# Patient Record
Sex: Male | Born: 1955 | Race: Asian | Hispanic: No | Marital: Married | Smoking: Former smoker
Health system: Southern US, Community
[De-identification: ages and names within clinical notes are randomized; demographics above are authoritative.]

---

## 2010-03-20 ENCOUNTER — Emergency Department (HOSPITAL_COMMUNITY): Admission: EM | Admit: 2010-03-20 | Discharge: 2010-03-20 | Payer: Self-pay | Admitting: Emergency Medicine

## 2010-03-28 ENCOUNTER — Emergency Department (HOSPITAL_COMMUNITY): Admission: EM | Admit: 2010-03-28 | Discharge: 2010-03-29 | Payer: Self-pay | Admitting: Emergency Medicine

## 2010-04-11 ENCOUNTER — Ambulatory Visit: Payer: Self-pay | Admitting: Internal Medicine

## 2010-04-11 ENCOUNTER — Encounter (INDEPENDENT_AMBULATORY_CARE_PROVIDER_SITE_OTHER): Payer: Self-pay | Admitting: Family Medicine

## 2010-04-11 LAB — CONVERTED CEMR LAB
Alkaline Phosphatase: 63 units/L (ref 39–117)
Chloride: 105 meq/L (ref 96–112)
Creatinine, Ser: 0.93 mg/dL (ref 0.40–1.50)
Total Protein: 7 g/dL (ref 6.0–8.3)

## 2010-04-25 ENCOUNTER — Ambulatory Visit (HOSPITAL_COMMUNITY): Admission: RE | Admit: 2010-04-25 | Discharge: 2010-04-25 | Payer: Self-pay | Admitting: Family Medicine

## 2010-11-19 LAB — URINALYSIS, ROUTINE W REFLEX MICROSCOPIC
Bilirubin Urine: NEGATIVE
Hgb urine dipstick: NEGATIVE
Ketones, ur: NEGATIVE mg/dL
Protein, ur: NEGATIVE mg/dL
Specific Gravity, Urine: 1.02 (ref 1.005–1.030)
Urobilinogen, UA: 0.2 mg/dL (ref 0.0–1.0)
Urobilinogen, UA: 0.2 mg/dL (ref 0.0–1.0)
pH: 6.5 (ref 5.0–8.0)

## 2010-11-19 LAB — DIFFERENTIAL
Basophils Absolute: 0.1 10*3/uL (ref 0.0–0.1)
Basophils Absolute: 0.1 10*3/uL (ref 0.0–0.1)
Basophils Relative: 1 % (ref 0–1)
Eosinophils Absolute: 0.1 10*3/uL (ref 0.0–0.7)
Eosinophils Absolute: 0.1 10*3/uL (ref 0.0–0.7)
Eosinophils Relative: 1 % (ref 0–5)
Eosinophils Relative: 1 % (ref 0–5)
Lymphocytes Relative: 16 % (ref 12–46)
Monocytes Absolute: 0.5 10*3/uL (ref 0.1–1.0)
Monocytes Absolute: 1 10*3/uL (ref 0.1–1.0)
Neutro Abs: 8.3 10*3/uL — ABNORMAL HIGH (ref 1.7–7.7)
Neutrophils Relative %: 73 % (ref 43–77)

## 2010-11-19 LAB — BASIC METABOLIC PANEL
BUN: 23 mg/dL (ref 6–23)
CO2: 27 mEq/L (ref 19–32)
Calcium: 8.4 mg/dL (ref 8.4–10.5)
Creatinine, Ser: 1.22 mg/dL (ref 0.4–1.5)
GFR calc non Af Amer: >60 mL/min (ref >60)
Glucose, Bld: 79 mg/dL (ref 70–99)
Potassium: 3 mEq/L — ABNORMAL LOW (ref 3.5–5.1)
Sodium: 142 mEq/L (ref 135–145)

## 2010-11-19 LAB — URINE CULTURE

## 2010-11-19 LAB — URINE MICROSCOPIC-ADD ON

## 2010-11-19 LAB — HEPATIC FUNCTION PANEL
ALT: 30 U/L (ref 0–53)
Albumin: 3.8 g/dL (ref 3.5–5.2)
Alkaline Phosphatase: 78 U/L (ref 39–117)
Indirect Bilirubin: 0.5 mg/dL (ref 0.3–0.9)
Total Protein: 7.6 g/dL (ref 6.0–8.3)

## 2010-11-19 LAB — CBC
Hemoglobin: 14.8 g/dL (ref 13.0–17.0)
Hemoglobin: 15.8 g/dL (ref 13.0–17.0)
MCH: 28.2 pg (ref 26.0–34.0)
MCH: 28.3 pg (ref 26.0–34.0)
MCHC: 34.1 g/dL (ref 30.0–36.0)
MCV: 83 fL (ref 78.0–100.0)
MCV: 83.2 fL (ref 78.0–100.0)
Platelets: 263 10*3/uL (ref 150–400)
RBC: 5.6 MIL/uL (ref 4.22–5.81)
WBC: 10.8 10*3/uL — ABNORMAL HIGH (ref 4.0–10.5)

## 2010-11-19 LAB — GC/CHLAMYDIA PROBE AMP, URINE
Chlamydia, Swab/Urine, PCR: NEGATIVE
GC Probe Amp, Urine: NEGATIVE

## 2010-11-19 LAB — CLOSTRIDIUM DIFFICILE EIA

## 2010-11-19 LAB — OVA AND PARASITE EXAMINATION

## 2010-11-19 LAB — STOOL CULTURE

## 2011-09-23 ENCOUNTER — Ambulatory Visit (INDEPENDENT_AMBULATORY_CARE_PROVIDER_SITE_OTHER): Payer: Commercial Managed Care - PPO

## 2011-09-23 DIAGNOSIS — R05 Cough: Secondary | ICD-10-CM

## 2011-10-19 ENCOUNTER — Ambulatory Visit (INDEPENDENT_AMBULATORY_CARE_PROVIDER_SITE_OTHER): Payer: Commercial Managed Care - PPO | Admitting: Family Medicine

## 2011-10-19 VITALS — BP 113/76 | HR 74 | Temp 97.8°F | Resp 16 | Ht 64.0 in | Wt 186.0 lb

## 2011-10-19 DIAGNOSIS — H1030 Unspecified acute conjunctivitis, unspecified eye: Secondary | ICD-10-CM

## 2011-10-19 DIAGNOSIS — Z0000 Encounter for general adult medical examination without abnormal findings: Secondary | ICD-10-CM | POA: Insufficient documentation

## 2011-10-19 DIAGNOSIS — H109 Unspecified conjunctivitis: Secondary | ICD-10-CM

## 2011-10-19 DIAGNOSIS — J209 Acute bronchitis, unspecified: Secondary | ICD-10-CM

## 2011-10-19 DIAGNOSIS — R059 Cough, unspecified: Secondary | ICD-10-CM

## 2011-10-19 DIAGNOSIS — E663 Overweight: Secondary | ICD-10-CM

## 2011-10-19 DIAGNOSIS — R05 Cough: Secondary | ICD-10-CM

## 2011-10-19 MED ORDER — LEVOFLOXACIN 500 MG PO TABS: 500.0000 mg | ORAL_TABLET | Freq: Every day | ORAL | Status: AC

## 2011-10-19 MED ORDER — TOBRAMYCIN 0.3 % OP SOLN: 1.0000 [drp] | OPHTHALMIC | Status: AC

## 2011-10-19 MED ORDER — HYDROCODONE-HOMATROPINE 5-1.5 MG/5ML PO SYRP
5.0000 mL | ORAL_SOLUTION | Freq: Four times a day (QID) | ORAL | Status: AC | PRN
Start: 2011-10-19 — End: 2011-10-29

## 2011-10-19 NOTE — Progress Notes (Signed)
  Subjective:    Patient ID: Nathaniel Silva, male    DOB: [birth year], 56 y.o.   MRN: [id number]  Cough This is a new problem. The current episode started in the past 7 days. The problem has been gradually worsening. The problem occurs constantly. The cough is productive of purulent sputum. Associated symptoms include nasal congestion, rhinorrhea, a sore throat and wheezing. Pertinent negatives include no chest pain, ear congestion, fever, heartburn, hemoptysis, myalgias, rash, shortness of breath, sweats or weight loss.  Sore Throat  Associated symptoms include coughing. Pertinent negatives include no shortness of breath.      Review of Systems  Constitutional: Negative for fever and weight loss.  HENT: Positive for sore throat and rhinorrhea.   Respiratory: Positive for cough and wheezing. Negative for hemoptysis and shortness of breath.   Cardiovascular: Negative for chest pain.  Gastrointestinal: Negative for heartburn.  Musculoskeletal: Negative for myalgias.  Skin: Negative for rash.  All other systems reviewed and are negative.       Objective:   Physical Exam  Constitutional: He is oriented to person, place, and time. He appears well-developed and well-nourished.  HENT:  Head: Normocephalic and atraumatic.  Right Ear: External ear normal.  Left Ear: External ear normal.  Mouth/Throat: Oropharynx is clear and moist.  Eyes: Pupils are equal, round, and reactive to light. Right eye discharge: red left conjunctiva.  Neck: Normal range of motion. Neck supple. No thyromegaly present.  Cardiovascular: Normal rate, normal heart sounds and intact distal pulses.   Pulmonary/Chest: Effort normal and breath sounds normal.  Lymphadenopathy:    He has no cervical adenopathy.  Neurological: He is alert and oriented to person, place, and time.  Skin: Skin is warm and dry.  Psychiatric: He has a normal mood and affect. His behavior is normal. Thought content normal.            Assessment & Plan:  Mild conjunctivitis Acute, recurrent bronchitis  PLAN: tobrex eye levaquin hydromet Work note today

## 2012-12-07 ENCOUNTER — Emergency Department (HOSPITAL_COMMUNITY)
Admission: EM | Admit: 2012-12-07 | Discharge: 2012-12-07 | Disposition: A | Discharge status: Home / Self Care | Payer: BC Managed Care – PPO | Source: Home / Self Care | Attending: Emergency Medicine | Admitting: Emergency Medicine

## 2012-12-07 ENCOUNTER — Encounter (HOSPITAL_COMMUNITY): Payer: Self-pay | Admitting: Emergency Medicine

## 2012-12-07 DIAGNOSIS — L282 Other prurigo: Secondary | ICD-10-CM

## 2012-12-07 MED ORDER — PREDNISONE 20 MG PO TABS: 40.0000 mg | ORAL_TABLET | Freq: Every day | ORAL | Status: DC

## 2012-12-07 MED ORDER — PREDNISONE 20 MG PO TABS
ORAL_TABLET | ORAL | Status: AC
  Filled 2012-12-07: qty 3

## 2012-12-07 MED ORDER — PREDNISONE 20 MG PO TABS
60.0000 mg | ORAL_TABLET | Freq: Once | ORAL | Status: AC
  Administered 2012-12-07: 60 mg via ORAL

## 2012-12-07 MED ORDER — HYDROXYZINE HCL 25 MG PO TABS: 25.0000 mg | ORAL_TABLET | Freq: Four times a day (QID) | ORAL | Status: DC

## 2012-12-07 NOTE — ED Notes (Signed)
C/o itching for one week.  Reports posterior areas of body itching, keeps him awake at night, frequently wakes him up

## 2012-12-12 NOTE — ED Provider Notes (Signed)
History     CSN: [id number]  Arrival date & time 12/07/12  1435   First MD Initiated Contact with Patient 12/07/12 1507      Chief Complaint  Patient presents with  . Pruritis    HPI: Patient is a 57 y.o. male presenting with rash. The history is provided by the patient.  Rash Location:  Torso, shoulder/arm and leg Shoulder/arm rash location:  R forearm and L forearm Torso rash location:  Lower back and upper back Leg rash location:  R upper leg and L upper leg Quality: itchiness and redness   Onset quality:  Gradual Duration:  1 week Timing:  Constant Progression:  Worsening Chronicity:  New Context: not animal contact, not chemical exposure, not exposure to similar rash, not food, not insect bite/sting, not medications and not new detergent/soap   Ineffective treatments:  Antihistamines Associated symptoms: no fever, no throat swelling, no tongue swelling and no URI   Pt reports a one-week history of persistent itchy rash. States the itching keeps him up at night. The itching to his back is the worst. Also reports areas of itchy rash to bilateral inner thighs and bilateral posterior forearms Denies ever having this rash before. Denies new exposures to food, chemicals or medications. No else in the home has had this rash. Denies any recent yard. Denies any respiratory or other associated symptoms. Has received minimal relief of itching w/ Benadryl.   History reviewed. No pertinent past medical history.  History reviewed. No pertinent past surgical history.  No family history on file.  History  Substance Use Topics  . Smoking status: Former Games developer  . Smokeless tobacco: Not on file  . Alcohol Use: No      Review of Systems  Constitutional: Negative.  Negative for fever.  HENT: Negative.   Eyes: Negative.   Respiratory: Negative.   Cardiovascular: Negative.   Gastrointestinal: Negative.   Endocrine: Negative.   Genitourinary: Negative.   Skin: Positive for rash.   Allergic/Immunologic: Negative.   Neurological: Negative.   Hematological: Negative.   Psychiatric/Behavioral: Negative.     Allergies  Review of patient's allergies indicates no known allergies.  Home Medications   Current Outpatient Rx  Name  Route  Sig  Dispense  Refill  . benzonatate (TESSALON) 100 MG capsule   Oral   Take 100 mg by mouth 3 (three) times daily as needed.         . hydrOXYzine (ATARAX/VISTARIL) 25 MG tablet   Oral   Take 1 tablet (25 mg total) by mouth every 6 (six) hours. Itching   12 tablet   0   . predniSONE (DELTASONE) 20 MG tablet   Oral   Take 2 tablets (40 mg total) by mouth daily.   10 tablet   0     BP 124/88  Pulse 63  Temp(Src) 97.2 F (36.2 C) (Oral)  Resp 18  SpO2 100%  Physical Exam  Constitutional: He is oriented to person, place, and time. He appears well-developed and well-nourished.  HENT:  Head: Normocephalic and atraumatic.  Eyes: Conjunctivae are normal.  Neck: Neck supple.  Cardiovascular: Normal rate and regular rhythm.   Pulmonary/Chest: Effort normal and breath sounds normal.  Musculoskeletal: Normal range of motion.  Neurological: He is alert and oriented to person, place, and time.  Skin: Skin is warm and dry. Rash noted.  Tiny, fine, erythematous, raised papules noted to entire back. Areas to bilateral posterior forearms and bilateral inner thighs noted with similar rash  though somewhat more sparse.   Psychiatric: He has a normal mood and affect.    ED Course  Procedures (including critical care time)  Labs Reviewed - No data to display No results found.   1. Pruritic rash       MDM  1 week h/o pruritic rash to back, posterior bil FA's and bil inner thighs. No known allergens. No h/o similar rash. No resp sx's. Minimal relief w/ Benadryl. Will treat w/ Prednisone x 1 week and Hydroxyzine for itching.         Leanne Chang, NP 12/12/12 [phone]

## 2012-12-12 NOTE — ED Provider Notes (Signed)
Medical screening examination/treatment/procedure(s) were performed by non-physician practitioner and as supervising physician I was immediately available for consultation/collaboration.  Patrisia Faeth, M.D.  Ashlley Booher C Jazmarie Biever, MD 12/12/12 2224 

## 2014-10-01 ENCOUNTER — Emergency Department (HOSPITAL_COMMUNITY): Payer: 59

## 2014-10-01 ENCOUNTER — Emergency Department (HOSPITAL_COMMUNITY)
Admission: EM | Admit: 2014-10-01 | Discharge: 2014-10-01 | Disposition: A | Discharge status: Home / Self Care | Payer: 59 | Attending: Emergency Medicine | Admitting: Emergency Medicine

## 2014-10-01 ENCOUNTER — Encounter (HOSPITAL_COMMUNITY): Payer: Self-pay

## 2014-10-01 DIAGNOSIS — T1490XA Injury, unspecified, initial encounter: Secondary | ICD-10-CM

## 2014-10-01 DIAGNOSIS — Y998 Other external cause status: Secondary | ICD-10-CM | POA: Diagnosis not present

## 2014-10-01 DIAGNOSIS — S20211A Contusion of right front wall of thorax, initial encounter: Secondary | ICD-10-CM | POA: Diagnosis not present

## 2014-10-01 DIAGNOSIS — W000XXA Fall on same level due to ice and snow, initial encounter: Secondary | ICD-10-CM | POA: Insufficient documentation

## 2014-10-01 DIAGNOSIS — Y9289 Other specified places as the place of occurrence of the external cause: Secondary | ICD-10-CM | POA: Insufficient documentation

## 2014-10-01 DIAGNOSIS — Z87891 Personal history of nicotine dependence: Secondary | ICD-10-CM | POA: Diagnosis not present

## 2014-10-01 DIAGNOSIS — Y9389 Activity, other specified: Secondary | ICD-10-CM | POA: Insufficient documentation

## 2014-10-01 DIAGNOSIS — S40011A Contusion of right shoulder, initial encounter: Secondary | ICD-10-CM | POA: Diagnosis not present

## 2014-10-01 DIAGNOSIS — S299XXA Unspecified injury of thorax, initial encounter: Secondary | ICD-10-CM | POA: Diagnosis present

## 2014-10-01 MED ORDER — NAPROXEN 500 MG PO TABS: 500.0000 mg | ORAL_TABLET | Freq: Two times a day (BID) | ORAL | Status: DC

## 2014-10-01 MED ORDER — KETOROLAC TROMETHAMINE 60 MG/2ML IM SOLN
60.0000 mg | Freq: Once | INTRAMUSCULAR | Status: AC
  Administered 2014-10-01: 60 mg via INTRAMUSCULAR
  Filled 2014-10-01: qty 2

## 2014-10-01 NOTE — ED Notes (Signed)
Pt fell on right side on Monday.  Slipped on ice.  Unable to get pain under control.  Some shortness of breath d/t pain.

## 2014-10-01 NOTE — ED Notes (Signed)
Patient transported to X-ray 

## 2014-10-01 NOTE — Discharge Instructions (Signed)
xrays are normal - no broken bones!  Naprosyn twice daily.  Please call your doctor for a followup appointment within 24-48 hours. When you talk to your doctor please let them know that you were seen in the emergency department and have them acquire all of your records so that they can discuss the findings with you and formulate a treatment plan to fully care for your new and ongoing problems.   Emergency Department Resource Guide 1) Find a Doctor and Pay Out of Pocket Although you won't have to find out who is covered by your insurance plan, it is a good idea to ask around and get recommendations. You will then need to call the office and see if the doctor you have chosen will accept you as a new patient and what types of options they offer for patients who are self-pay. Some doctors offer discounts or will set up payment plans for their patients who do not have insurance, but you will need to ask so you aren't surprised when you get to your appointment.  2) Contact Your Local Health Department Not all health departments have doctors that can see patients for sick visits, but many do, so it is worth a call to see if yours does. If you don't know where your local health department is, you can check in your phone book. The CDC also has a tool to help you locate your state's health department, and many state websites also have listings of all of their local health departments.  3) Find a Walk-in Clinic If your illness is not likely to be very severe or complicated, you may want to try a walk in clinic. These are popping up all over the country in pharmacies, drugstores, and shopping centers. They're usually staffed by nurse practitioners or physician assistants that have been trained to treat common illnesses and complaints. They're usually fairly quick and inexpensive. However, if you have serious medical issues or chronic medical problems, these are probably not your best option.  No Primary Care  Doctor: - Call Health Connect at  [phone] - they can help you locate a primary care doctor that  accepts your insurance, provides certain services, etc. - Physician Referral Service- [phone]  Chronic Pain Problems: Organization         Address  Phone   Notes  [facility]  [phone] Patients need to be referred by their primary care doctor.   Medication Assistance: Organization         Address  Phone   Notes  [facility]ication [facility] [address] [place], [place] [address] [phone] --Must be a resident of [facility] [place] -- Must have NO insurance coverage whatsoever (no Medicaid/ Medicare, etc.) -- The pt. MUST have a primary care doctor that directs their care regularly and follows them in the community   MedAssist  [phone]   [employer]  [phone]    Agencies that provide inexpensive medical care: Organization         Address  Phone   Notes  [facility]  [phone]   [facility]    [phone]   [facility] [address] [place], [place] [address] [phone]   [facility] [address], [place] [phone]   Planned Parenthood    [phone]   [facility]    [phone]   Community Health  and [address], [place] Phone:  [phone], Fax:  [phone] Hours of Operation:  9 am - 6 pm, M-F.  Also accepts Medicaid/Medicare and self-pay.  [facility] for [place], [address], [place] Phone: [phone], Fax: [phone]. Hours of Operation:  8:30 am - 5:30 pm, M-F.  Also accepts Medicaid and self-pay.  [facility] [place] [address] [place] Phone: [phone]   [place], [place], [place] [phone], Ext. [phone] Mondays & Thursdays: 7-9 AM.  First 15 patients are seen on a first  come, first serve basis.    [place] Providers:  Organization         Address  Phone   Notes  [facility] [address], [place] [phone] Also accepts self-pay patients.  [facility] [address], [place]  [phone]   [place], [address], [place] [phone]   [facility] [address] [place] [phone]   Lucianne Lei [address], [place]   [phone] Only accepts [place] Access [place] patients after they have their name applied to their card.   Self-Pay (no insurance) in [facility]:  Organization         Address  Phone   Notes  Sickle Cell Patients, [facility] [place] [phone]   [facility] [place] [phone]   [facility] [place]  [place], [place], [place] [phone]   [facility]/Dr. Osei-Bonsu  [address] [place] or [address], [place] [phone] Phone number for both [place] and [place] locations is the same.  [facility] [address] [place] [phone]   [facility] [address], [place] or [address] [phone]   [facility] [address], [place] [phone], phone; [phone], fax Sees patients 1st and 3rd Saturday of every month.  Must not qualify for public or private insurance (i.e. Medicaid, Medicare, [place] Health Choice, Veterans' Benefits)  Household income should be no more than 200% of the poverty level The clinic cannot treat you if you are pregnant or think you are pregnant  Sexually transmitted diseases are not treated at the clinic.    Dental Care: Organization          Address  Phone  Notes  [facility] [place] [phone] Accepts children up to age 79 who are enrolled in [place] or [place]; pregnant women with a Medicaid card; and children who have applied for Medicaid or [place] Health Choice, but were declined, whose parents can pay a reduced fee at time of service.  [facility] Department of [facility]  [address], [place] [phone] Accepts children up to age 42 who are enrolled in [place] or [place]; pregnant women with a Medicaid card; and children who have applied for Medicaid or [place] Health Choice, but were declined, whose parents can pay a reduced fee at time of service.  [facility]  [address]  Mardene Speak [phone] Patients are seen by appointment only. Walk-ins are not accepted. [place] will see patients 60 years of age and older. Monday - Tuesday (8am-5pm) Most Wednesdays (8:30-5pm) $30 per visit, cash only  [facility] Adult [facility] Access PROGRAM  [address], [facility] [phone] Patients are seen by appointment only. Walk-ins are not accepted. Clarissa will see patients 9 years of age and older. One Wednesday Evening (Monthly: Volunteer Based).  $30 per visit, cash only  [place]  [phone] for adults; Children under age 8, call Graduate Pediatric Dentistry at [phone]. Children aged 87-14, please call [phone] to request a pediatric application.  Dental services are provided in all areas of dental care including fillings, crowns and bridges, complete and partial dentures, implants, gum treatment, root canals, and extractions. Preventive care is also provided. Treatment is provided to both adults and children. Patients are selected via a lottery and there is often a waiting list.   [facility] [address], [place]  [phone] [email or web address]   [facility] [address] [place], [place] [phone], Ext. [phone] Second and Fourth Thursday of each month, opens at 6:30 AM; Clinic ends at 9 AM.  Patients are seen on a first-come first-served basis, and a limited number are seen during each clinic.   [facility]  [address] Hillard Danker [place], [place] [phone]   Eligibility Requirements You must have lived in [place], [place], or [place] counties for at least the last three months.   You cannot be eligible for state or federal sponsored [employer], including [employer], [place], or [employer].   You generally cannot be eligible for healthcare insurance through your employer.    How to apply: Eligibility screenings are held every Tuesday and Wednesday afternoon from 1:00 pm until 4:00 pm. You do not need an appointment for the interview!  [facility] [address] [place], [place]   [place]  [place] Department  [place]  [phone]    Behavioral Health Resources in the Community: Intensive Outpatient Programs Organization         Address  Phone  Notes  [facility] [address], [place], [place] [phone]   [facility] Outpatient [address], [place], [place]   ADS: Alcohol & Drug Svcs [address], [place], [place]   [place] [address]  [place], [place] or [phone]   Substance Abuse Resources Organization         Address  Phone  Notes  Alcohol and Drug Services  [phone]   [place]  [phone]   [facility] [place]   Chinita Pester  [phone]   Residential & Outpatient Substance Abuse Program  [phone]   Psychological  Services Organization         Address  Phone  Notes  [facility]  [place]  [phone]   [place] [address] [place] or [phone]    Mobile Crisis Teams Organization         Address  Phone  Notes  [facility], Mobile Crisis Care Unit  [phone]   Assertive Psychotherapeutic Services  [address] [place], [place]   [place] Levels  [place] [phone]    Self-Help/Support Groups Organization         Address  Phone             Notes  [facility] of [place] - variety of support groups  [place] Call for more information  Narcotics Anonymous (NA), [facility]aring [facility] [address], [employer] [place]  2 meetings at this location   Special educational needs teacher         Address  Phone  Notes  [facility] Residential Treatment [place],    [place]  [phone]   [facility]  [address] [place] [id number], [employer], [place]   [place], [place] [phone] Admissions: 8am-3pm M-F  Incentives Substance [place] [address],    [place], [place] [phone]   [facility] [address] [place], [place], [place]   [facility] [address],  [place], [place]   [facility] Programs - Intensive Outpatient [address], [place], [place]   [facility] [place] ([place].) [place] [address].,  [place], [place] [phone] or [phone]   Residential Treatment Services (RTS) [address] [place], [place] Accepts Medicaid  [facility] [place] [address]  [place] [phone] Substance Abuse/Addiction Treatment   [facility] Organization         Address  Phone  Notes  [facility]  [phone]   Domenic Schwab, PhD [address] Arlis [place], [place]   [phone] or [phone]   [place], [place] [phone]   [facility] [address] [place], [place] [phone] Insurance/Medicaid/sponsorship through [facility] and Families [address]                                    [place], [place] [phone] [place] [address], [place] [phone]    Dr. Adele Schilder  [phone]   [facility] Dept. 1) [address], [place] 2) [place] 3)  [place] [address], [place] [phone]  [phone]   [place] [phone] or [phone] (After Hours)

## 2014-10-01 NOTE — ED Provider Notes (Signed)
CSN: [id number]     Arrival date & time 10/01/14  [id number] History   First MD Initiated Contact with Patient 10/01/14 0919     Chief Complaint  Patient presents with  . Rib Injury     (Consider location/radiation/quality/duration/timing/severity/associated sxs/prior Treatment) HPI Comments: Pt had fall 3 days ago which occurred on snow - fell to the R side - shoulder, ribs knee and wrist. Symptoms are persistent, gradually improving though they still hurt. He has been using Aleve with minimal improvement, denies head injury, neck pain, numbness or weakness. Ambulate without difficulty, no cough, no fever.  The history is provided by the patient.    History reviewed. No pertinent past medical history. History reviewed. No pertinent past surgical history. History reviewed. No pertinent family history. History  Substance Use Topics  . Smoking status: Former Games developermoker  . Smokeless tobacco: Not on file  . Alcohol Use: No    Review of Systems  All other systems reviewed and are negative.     Allergies  Review of patient's allergies indicates no known allergies.  Home Medications   Prior to Admission medications   Medication Sig Start Date End Date Taking? Authorizing Provider  hydrOXYzine (ATARAX/VISTARIL) 25 MG tablet Take 1 tablet (25 mg total) by mouth every 6 (six) hours. Itching Patient not taking: Reported on 10/01/2014 12/07/12   Roma KayserKatherine P Schorr, NP  naproxen (NAPROSYN) 500 MG tablet Take 1 tablet (500 mg total) by mouth 2 (two) times daily with a meal. 10/01/14   Vida RollerBrian D Latoria Dry, MD  predniSONE (DELTASONE) 20 MG tablet Take 2 tablets (40 mg total) by mouth daily. Patient not taking: Reported on 10/01/2014 12/07/12   Roma KayserKatherine P Schorr, NP   BP 155/91 mmHg  Pulse 70  Temp(Src) 97.4 F (36.3 C) (Oral)  Resp 18  SpO2 98% Physical Exam  Constitutional: He appears well-developed and well-nourished. No distress.  HENT:  Head: Normocephalic and atraumatic.  Mouth/Throat:  Oropharynx is clear and moist. No oropharyngeal exudate.  Eyes: Conjunctivae and EOM are normal. Pupils are equal, round, and reactive to light. Right eye exhibits no discharge. Left eye exhibits no discharge. No scleral icterus.  Neck: Normal range of motion. Neck supple. No JVD present. No thyromegaly present.  Cardiovascular: Normal rate, regular rhythm, normal heart sounds and intact distal pulses.  Exam reveals no gallop and no friction rub.   No murmur heard. Pulmonary/Chest: Effort normal and breath sounds normal. No respiratory distress. He has no wheezes. He has no rales. He exhibits tenderness ( Tender to palpation over the right anterolateral inferior chest wall, no crepitance or subcutaneous emphysema).  Abdominal: Soft. Bowel sounds are normal. He exhibits no distension and no mass. There is no tenderness.  Musculoskeletal: Normal range of motion. He exhibits no edema or tenderness.  Supple joints including right shoulder, right elbow, right wrist, right knee, right hip, right ankle. No deformity, minimal pain with range of motion of the right shoulder  Lymphadenopathy:    He has no cervical adenopathy.  Neurological: He is alert. Coordination normal.  Normal speech, normal gait, normal coordination  Skin: Skin is warm and dry. No rash noted. No erythema.  Psychiatric: He has a normal mood and affect. His behavior is normal.  Nursing note and vitals reviewed.   ED Course  Procedures (including critical care time) Labs Review Labs Reviewed - No data to display  Imaging Review Dg Ribs Unilateral W/chest Right  10/01/2014   CLINICAL DATA:  Acute right rib pain after  falling on ice 4 days ago. Initial encounter.  EXAM: RIGHT RIBS AND CHEST - 3+ VIEW  COMPARISON:  None.  FINDINGS: No fracture or other bone lesions are seen involving the ribs. There is no evidence of pneumothorax or pleural effusion. Both lungs are clear. Heart size and mediastinal contours are within normal limits.   IMPRESSION: No abnormality seen involving the right ribs. No acute cardiopulmonary abnormality seen.   Electronically Signed   By: Roque Lias M.D.   On: 10/01/2014 10:24   Dg Shoulder Right  10/01/2014   CLINICAL DATA:  Patient fell on ice 4 days prior.  Pain  EXAM: RIGHT SHOULDER - 2+ VIEW  COMPARISON:  None.  FINDINGS: Frontal, Y scapular, and axillary images were obtained. There is no appreciable fracture or dislocation. Joint spaces appear intact. No erosive change or intra-articular calcification.  IMPRESSION: No fracture or dislocation.  No appreciable arthropathy.   Electronically Signed   By: Bretta Bang M.D.   On: 10/01/2014 10:21      MDM   Final diagnoses:  Trauma  Contusion of ribs, right, initial encounter  Contusion of right shoulder, initial encounter    Injuries to the right side of the patient's body, very low suspicion for injury to the right hand or wrist or the right knee. He does have increased pain in the right shoulder and wrist with palpation, imaging pending, pain medication ordered.  Xray neg, stable for d/c.  Meds given in ED:  Medications  ketorolac (TORADOL) injection 60 mg (60 mg Intramuscular Given 10/01/14 0943)    New Prescriptions   NAPROXEN (NAPROSYN) 500 MG TABLET    Take 1 tablet (500 mg total) by mouth 2 (two) times daily with a meal.       Vida Roller, MD 10/01/14 1032

## 2014-10-14 ENCOUNTER — Encounter: Payer: Self-pay | Admitting: Family

## 2014-10-14 ENCOUNTER — Ambulatory Visit (INDEPENDENT_AMBULATORY_CARE_PROVIDER_SITE_OTHER): Payer: 59 | Admitting: Family

## 2014-10-14 VITALS — BP 160/112 | HR 69 | Temp 97.9°F | Resp 18 | Ht 66.0 in | Wt 196.8 lb

## 2014-10-14 DIAGNOSIS — M25561 Pain in right knee: Secondary | ICD-10-CM

## 2014-10-14 DIAGNOSIS — W009XXD Unspecified fall due to ice and snow, subsequent encounter: Secondary | ICD-10-CM

## 2014-10-14 DIAGNOSIS — M25511 Pain in right shoulder: Secondary | ICD-10-CM

## 2014-10-14 DIAGNOSIS — W009XXA Unspecified fall due to ice and snow, initial encounter: Secondary | ICD-10-CM | POA: Insufficient documentation

## 2014-10-14 MED ORDER — MELOXICAM 7.5 MG PO TABS: 7.5000 mg | ORAL_TABLET | Freq: Every day | ORAL | Status: DC

## 2014-10-14 MED ORDER — METHYLPREDNISOLONE (PAK) 4 MG PO TABS: ORAL_TABLET | ORAL | Status: DC

## 2014-10-14 NOTE — Assessment & Plan Note (Signed)
Patient continues to experience soreness in his right shoulder ribs and right knee from his fall. Symptoms and exam are consistent with contusions. Imaging was negative for fractures or abnormalities. Discontinue Naprosyn. Start Mobic and Medrol Dosepak. Recommend heat and ice as needed for pain and soreness. Follow up if symptoms worsen or fail to improve.

## 2014-10-14 NOTE — Progress Notes (Signed)
Subjective:    Patient ID: Nathaniel Silva, male    DOB: [birth year], 59 y.o.   MRN: [id number]  Chief Complaint  Patient presents with  . Establish Care    Came about his visit to the ED    HPI:  Nathaniel Silva is a 59 y.o. male who presents today to establish care and discuss a recent ED visit.  Patient was recently seen in the emergency room following a fall on his right side indicating shoulder, ribs, knee, and wrist pain. X-ray results of the right ribs and right shoulder were negative for fracture or abnormality. He was given a shot of Toradol and released from the emergency room. All emergency room notes were reviewed in detail and x-rays were independently reviewed.  Associated symptoms of soreness in his right ribs, right knee and right shoulder have been going on for about 2 weeks since the initial fall. Currently continues to experiencing a pain intensity of a 7/10 in the ribs, a 3/10 in the right shoulder, and a 2/10 for the right knee. Pain in these areas is described as soreness. Currently takes naproxen which he indicates does help some.    No Known Allergies   Current Outpatient Prescriptions on File Prior to Visit  Medication Sig Dispense Refill  . hydrOXYzine (ATARAX/VISTARIL) 25 MG tablet Take 1 tablet (25 mg total) by mouth every 6 (six) hours. Itching 12 tablet 0  . naproxen (NAPROSYN) 500 MG tablet Take 1 tablet (500 mg total) by mouth 2 (two) times daily with a meal. 30 tablet 0   No current facility-administered medications on file prior to visit.    History reviewed. No pertinent past medical history.  History reviewed. No pertinent past surgical history.  History reviewed. No pertinent family history.  History   Social History  . Marital Status: Married    Spouse Name: N/A  . Number of Children: 2  . Years of Education: N/A   Occupational History  . cook    Social History Main Topics  . Smoking status: Former Smoker -- 0.50 packs/day for 15 years      Types: Cigarettes  . Smokeless tobacco: Never Used  . Alcohol Use: No  . Drug Use: No  . Sexual Activity: Not on file   Other Topics Concern  . Not on file   Social History Narrative   Born and raised in [place]. Currently lives in a house with his wife and children and son-in-law. No pets. Fun: Movies   Denies religious belief that would effect health care.     Review of Systems  Respiratory: Negative for chest tightness and shortness of breath.   Cardiovascular: Negative for chest pain.  Musculoskeletal: Positive for arthralgias.  Neurological: Positive for numbness.      Objective:    BP 160/112 mmHg  Pulse 69  Temp(Src) 97.9 F (36.6 C) (Oral)  Resp 18  Ht 5\' 6"  (1.676 m)  Wt 196 lb 12.8 oz (89.268 kg)  BMI 31.78 kg/m2  SpO2 98% Nursing note and vital signs reviewed.  Physical Exam  Constitutional: He is oriented to person, place, and time. He appears well-developed and well-nourished. No distress.  Cardiovascular: Normal rate, regular rhythm, normal heart sounds and intact distal pulses.   Pulmonary/Chest: Effort normal and breath sounds normal.  Musculoskeletal:  Right shoulder: No obvious deformity or discoloration noted. Mild edema present. Displays full range of motion in all directions. Tenderness elicited over subacromial space. Negative impingement; negative apprehension; negative drop arm. Right  knee: No obvious deformity, discoloration, or edema of right knee noted. Mild tenderness elicited over patellar tendon. Ligaments are intact and appropriate. Muscle strength is 4+/5. Right rib: No obvious deformities, however mild discoloration remains. Palpable tenderness along ribs 6 through 8. No deformities noted.  Neurological: He is alert and oriented to person, place, and time.  Skin: Skin is warm and dry.  Psychiatric: He has a normal mood and affect. His behavior is normal. Judgment and thought content normal.       Assessment & Plan:

## 2014-10-14 NOTE — Patient Instructions (Addendum)
Thank you for choosing [facility].  Summary/Instructions:  Please schedule a time for your physical.   Please stop taking the naproxen and start taking the Mobic and steroid dose pack.   Please use ice/heat on the sore areas as needed.  Try using the thermacare for consistent heat  For coughing and sneezing, splint your ribs with your arms or a pillow.  Your prescription(s) have been submitted to your pharmacy or been printed and provided for you. Please take as directed and contact our office if you believe you are having problem(s) with the medication(s) or have any questions.  Referrals have been made during this visit. You should expect to hear back from our schedulers in about 7-10 days in regards to establishing an appointment with the specialists we discussed.   If your symptoms worsen or fail to improve, please contact our office for further instruction, or in case of emergency go directly to the emergency room at the closest medical facility.    ??ng d?p (Contusion) ??ng d?p l v?t thm tm su. ??ng d?p l h?u qu? c?a ch?n th??ng gy ch?y mu d??i da. ??ng d?p c th? chuy?n thnh mu xanh, tm ho?c vng. Ch?n th??ng nh? s? ?? l?i v?t ??ng d?p khng ?au, nh?ng nh?ng ??ng d?p nghim tr?ng h?n c th? gy ?au ??n v s?ng m?t vi tu?n.  NGUYN NHN  ??ng d?p th??ng do m?t c ?nh, ch?n th??ng ho?c l?c tc ??ng tr?c ti?p ln m?t vng c?a c? th? gy ra. TRI?U CH?NG   S?ng v t?y ?? vng b? th??ng.  Thm tm vng b? th??ng.  Nh?y c?m ?au v ?au nh?c vng b? th??ng.  ?au. CH?N ?ON  Ch?n ?on c th? ???c ??a ra b?ng cch ki?m tra ti?n s? v khm th?c th?. C th? c?n ch?p X-quang, CT ho?c MRI ?? xc ??nh xem c b?t k? ch?n th??ng no lin quan, ch?ng h?n nh? gy x??ng. ?I?U TR?  ?i?u tr? c? th? s? ph? thu?c vo vng c? th? b? th??ng. Ni chung, bi?n php ?i?u tr? ??ng d?p t?t nh?t l ngh? ng?i, ch??m ?, nng cao v ch??m l?nh vng b? th??ng. Thu?c khng c?n k ??n c?ng  c th? ???c khuyn dng ?? ki?m sot c?n ?au. H?i chuyn gia ch?m [place] s?c kh?e cch ?i?u tr? t?t nh?t cho ch? ??ng d?p c?a qu v?. H??NG D?N CH?M [place] T?I NH   Ch??m ? l?nh ln vng b? th??ng.  Cho ? l?nh vo ti nh?a.  ?? kh?n t?m vo gi?a da v ti.  ?? ? l?nh trong kho?ng 15-20 pht, 3-4 l?n m?i ngy, ho?c theo ch? d?n c?a chuyn gia ch?m [place] s?c kh?e c?a qu v?.  Ch? s? d?ng thu?c khng c?n k ??n ho?c thu?c c?n k ??n ?? gi?m ?au, gi?m c?m gic kh ch?u ho?c h? s?t theo ch? d?n c?a chuyn gia ch?m [place] s?c kh?e c?a qu v?. Chuyn gia ch?m [place] s?c kh?e c th? [place]khuyn qu v? trnh s? d?ng cc thu?c ch?ng vim (aspirin, ibuprofen v naproxen) trong 48 gi? v nh?ng thu?c ny c th? lm thm tm t?ng ln.  ?? vng b? th??ng ngh? ng?i.  N?u c th?, hy nng cao vng b? th??ng ?? gi?m s?ng. NGAY L?P T?C ?I KHM N?U:   Qu v? b? thm tm ho?c s?ng t?ng ln.  Qu v? b? ?au ngy cng nhi?u.  S?ng hay ?au nh?c khng thuyn gi?m sau khi dng thu?c. ??M B?O QU  V?:   Hi?u r cc h??ng d?n ny.  S? theo di tnh tr?ng c?a mnh.  S? yu c?u tr? gip ngay l?p t?c n?u qu v? c?m th?y khng kh?e ho?c th?y tr?m tr?ng h?n. Document Released: 05/31/2005 Document Revised: 08/26/2013 [facility] Patient Information 2015 [place], [place]. This information is not intended to replace advice given to you by your health care provider. Make sure you discuss any questions you have with your health care provider.

## 2014-10-14 NOTE — Progress Notes (Signed)
Pre visit review using our clinic review tool, if applicable. No additional management support is needed unless otherwise documented below in the visit note. 

## 2016-09-04 IMAGING — CR DG SHOULDER 2+V*R*
3 series · 3 of 3 positions shown · non-contrast
Comparison: None.

CLINICAL DATA: Patient fell on ice 4 days prior.  Pain

EXAM:
RIGHT SHOULDER - 2+ VIEW

[w shoulder external right]
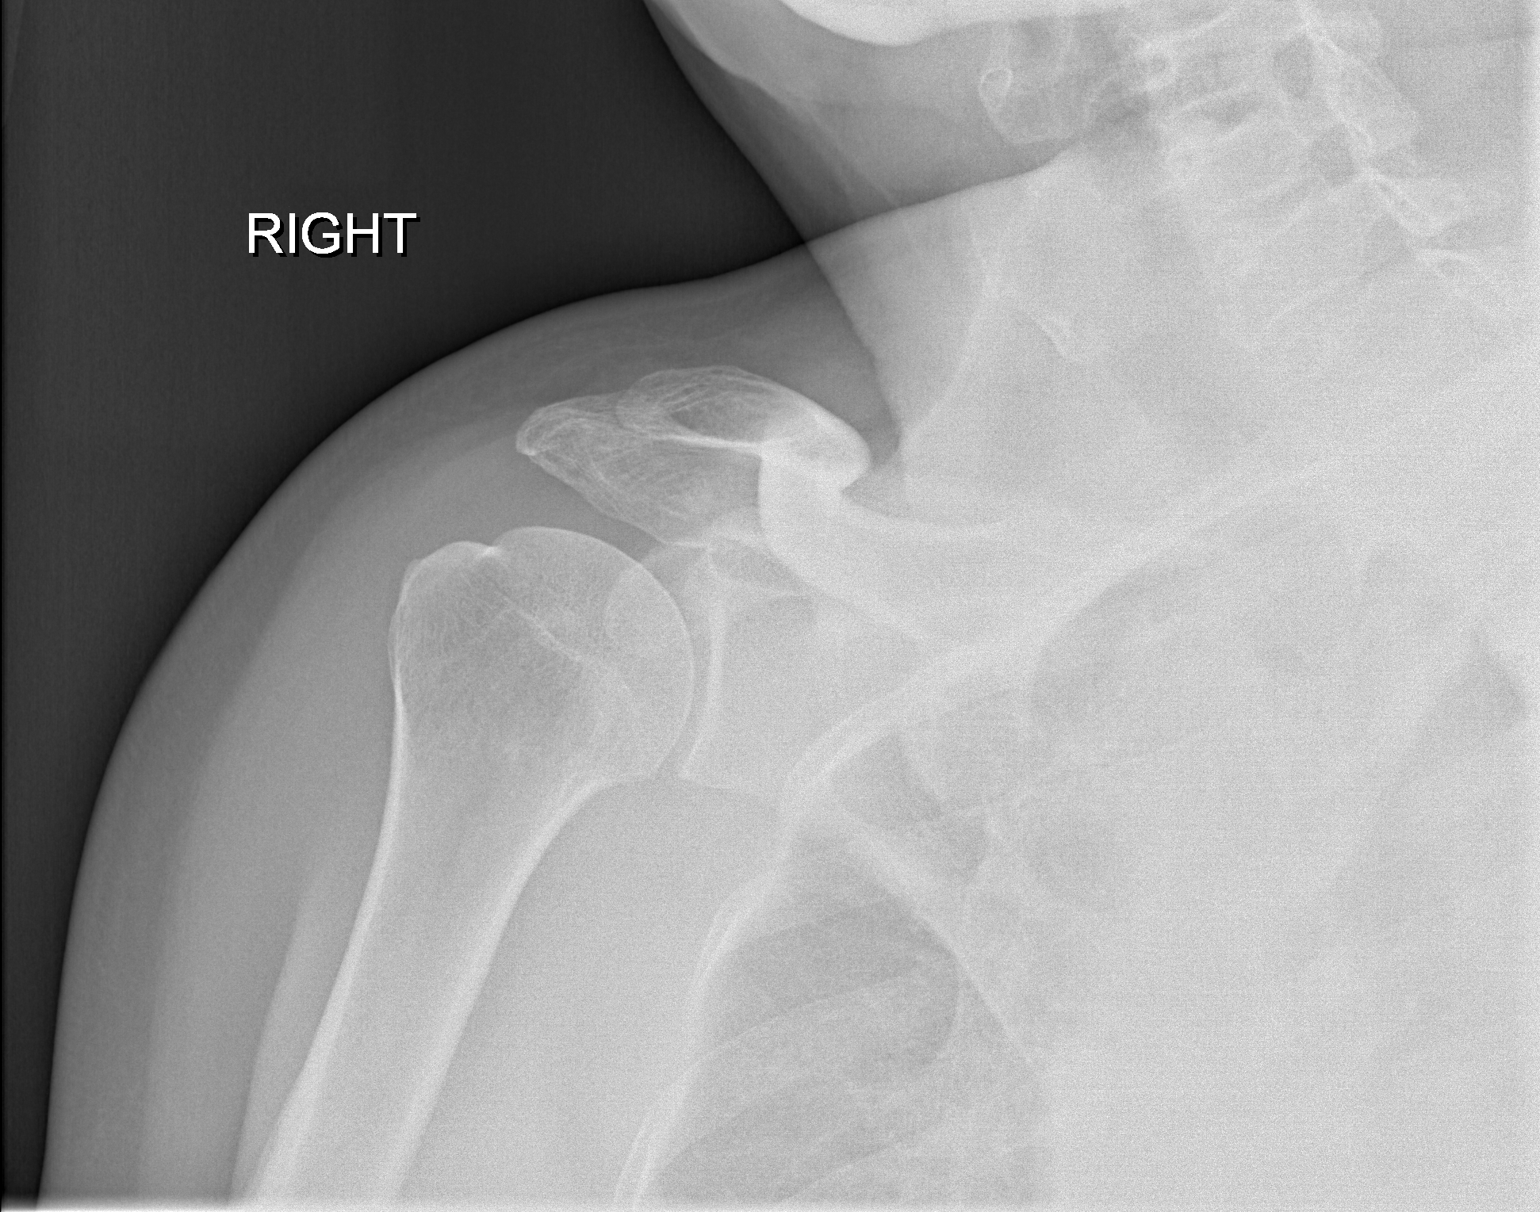

[w shoulder y-view right]
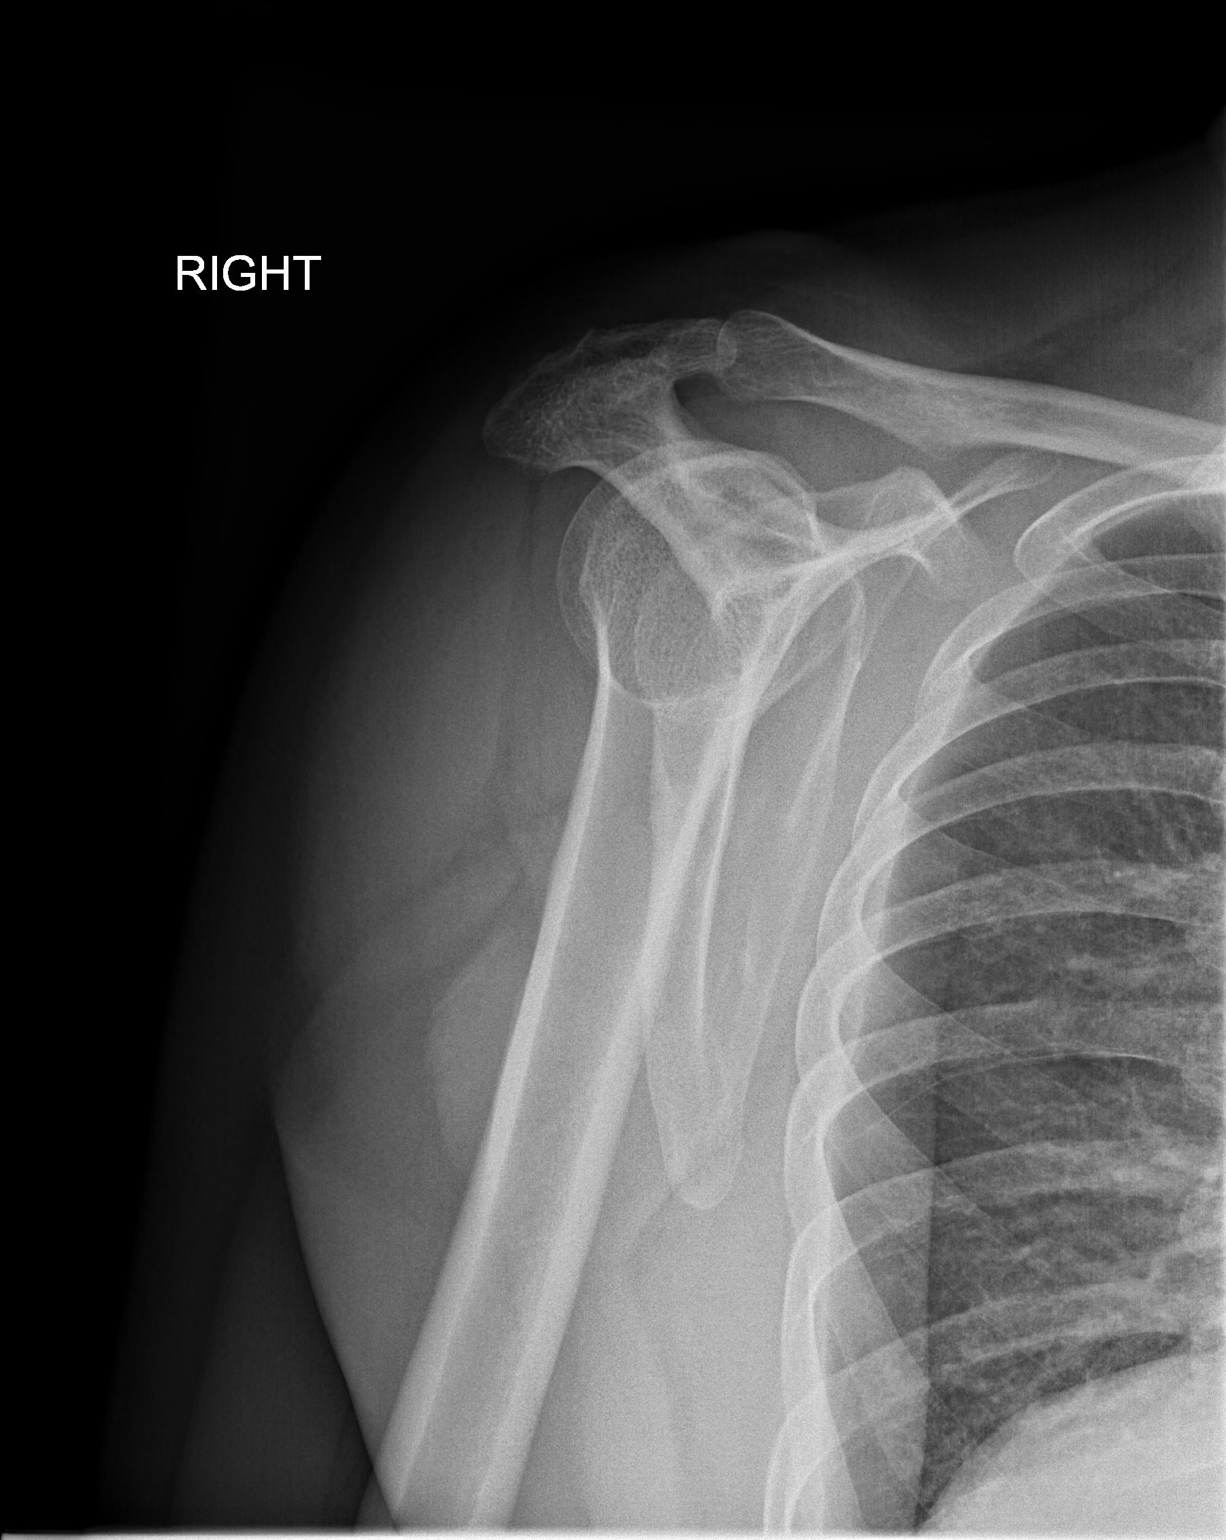

[x shoulder axillary right]
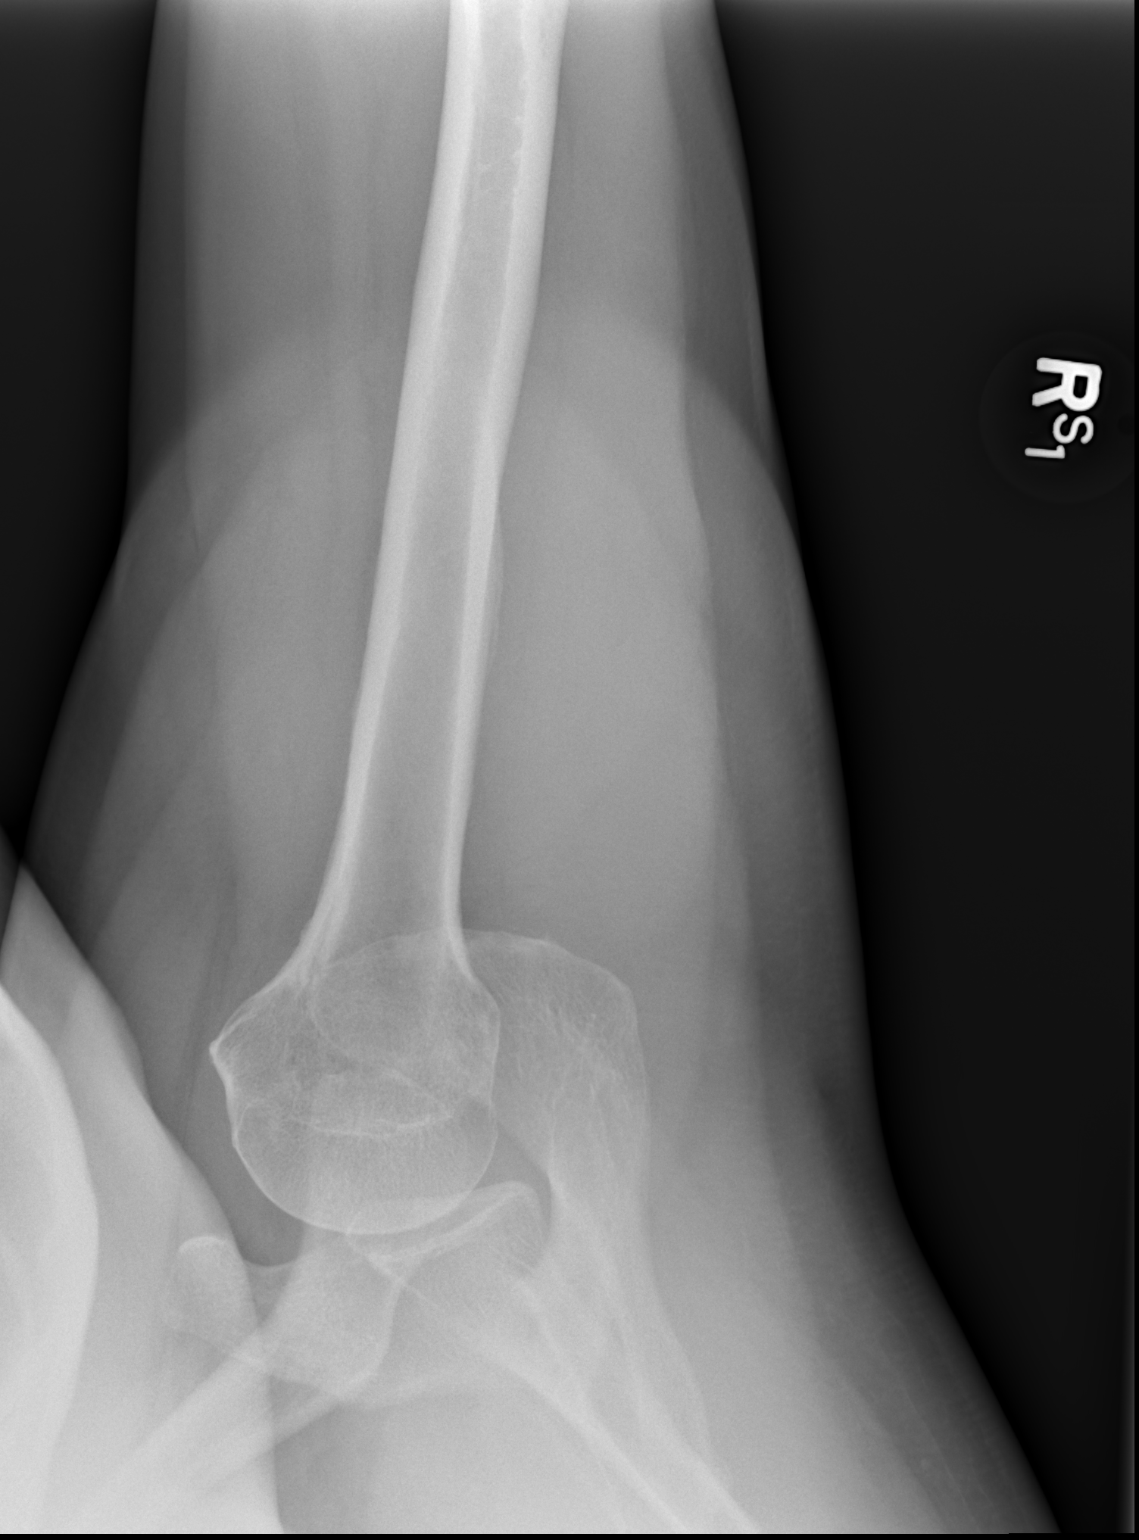

[3 of 3 positions shown; findings below may reference images not displayed]

FINDINGS: Frontal, Y scapular, and axillary images were obtained. There is no
appreciable fracture or dislocation. Joint spaces appear intact. No
erosive change or intra-articular calcification.
IMPRESSION: No fracture or dislocation.  No appreciable arthropathy.

## 2020-03-29 ENCOUNTER — Other Ambulatory Visit: Payer: Self-pay

## 2020-03-29 ENCOUNTER — Ambulatory Visit (HOSPITAL_COMMUNITY)
Admission: EM | Admit: 2020-03-29 | Discharge: 2020-03-29 | Disposition: A | Discharge status: Home / Self Care | Payer: 59

## 2020-03-29 ENCOUNTER — Encounter (HOSPITAL_COMMUNITY): Payer: Self-pay

## 2020-03-29 DIAGNOSIS — R42 Dizziness and giddiness: Secondary | ICD-10-CM | POA: Diagnosis not present

## 2020-03-29 DIAGNOSIS — M549 Dorsalgia, unspecified: Secondary | ICD-10-CM

## 2020-03-29 DIAGNOSIS — M545 Low back pain, unspecified: Secondary | ICD-10-CM

## 2020-03-29 DIAGNOSIS — H9209 Otalgia, unspecified ear: Secondary | ICD-10-CM

## 2020-03-29 DIAGNOSIS — I10 Essential (primary) hypertension: Secondary | ICD-10-CM | POA: Diagnosis not present

## 2020-03-29 LAB — CBG MONITORING, ED: Glucose-Capillary: 137 mg/dL — ABNORMAL HIGH (ref 70–99)

## 2020-03-29 MED ORDER — MECLIZINE HCL 25 MG PO TABS
25.0000 mg | ORAL_TABLET | Freq: Three times a day (TID) | ORAL | 0 refills | Status: DC | PRN
Start: 2020-03-29 — End: 2020-04-13

## 2020-03-29 MED ORDER — KETOROLAC TROMETHAMINE 30 MG/ML IJ SOLN
30.0000 mg | Freq: Once | INTRAMUSCULAR | Status: AC
  Administered 2020-03-29: 30 mg via INTRAMUSCULAR

## 2020-03-29 MED ORDER — PREDNISONE 10 MG (21) PO TBPK
ORAL_TABLET | Freq: Every day | ORAL | 0 refills | Status: DC
Start: 2020-03-29 — End: 2020-04-13

## 2020-03-29 MED ORDER — KETOROLAC TROMETHAMINE 60 MG/2ML IM SOLN
INTRAMUSCULAR | Status: AC
  Filled 2020-03-29: qty 2

## 2020-03-29 NOTE — ED Triage Notes (Signed)
Pt is here with back pain started 2 weeks ago after lifting a heavy box at home, pt has taken Advil to relieve discomfort. Pt's dizziness/ ear fully started 4 days ago.

## 2020-03-29 NOTE — Discharge Instructions (Addendum)
Will need to follow up for hypertension avoid high salts and diet  Use heat and ice as needed for pain Will need to follow up with primary and may need further testing  Continue to check sugar and take your metformin daily

## 2020-03-29 NOTE — ED Provider Notes (Signed)
[facility]    CSN: [id number] Arrival date & time: 03/29/20  1045      History   Chief Complaint Chief Complaint  Patient presents with  . Dizziness  . Otalgia  . Back Pain    HPI Nathaniel Silva is a 64 y.o. male.   Pt poor compliance and language barrier. Pt is to c/o dizzyness and rt upper back pain since moving a box. Was taking naproxen with no relief. States that dizziness is intermit not tall the time. BP is slightly elevated but pt states he is not on any medications for this. Pt seeing stinson for pcp but he has never told him of bp concerns. Pt has metformin with him but states that he is not taking this every day and his sugar can be 300 at times. Denies any headache, no blurred vision at this time. No weakness. Alert x4      No past medical history on file.  Patient Active Problem List   Diagnosis Date Noted  . Fall from slipping on ice 10/14/2014  . Healthcare maintenance 10/19/2011  . Overweight(278.02) 10/19/2011    No past surgical history on file.     Home Medications    Prior to Admission medications   Medication Sig Start Date End Date Taking? Authorizing Provider  cetirizine (ZYRTEC) 10 MG tablet Take by mouth. 01/08/18  Yes [provider]  meloxicam (MOBIC) 7.5 MG tablet Take by mouth. 03/20/18  Yes [provider]  metFORMIN (GLUCOPHAGE) 500 MG tablet Take by mouth. 03/20/18  Yes [provider]  hydrOXYzine (ATARAX/VISTARIL) 25 MG tablet Take 1 tablet (25 mg total) by mouth every 6 (six) hours. Itching 12/07/12   Schorr, Roma Kayser, NP  meclizine (ANTIVERT) 25 MG tablet Take 1 tablet (25 mg total) by mouth 3 (three) times daily as needed for dizziness. 03/29/20   Coralyn Mark, NP  meloxicam (MOBIC) 7.5 MG tablet Take 1 tablet (7.5 mg total) by mouth daily. 10/14/14   Veryl Speak, FNP  methylPREDNIsolone (MEDROL DOSPACK) 4 MG tablet follow package directions 10/14/14   Veryl Speak, FNP  naproxen  (NAPROSYN) 500 MG tablet Take 1 tablet (500 mg total) by mouth 2 (two) times daily with a meal. 10/01/14   Eber Hong, MD  predniSONE (STERAPRED UNI-PAK 21 TAB) 10 MG (21) TBPK tablet Take by mouth daily. Take 6 tabs by mouth daily  for 2 days, then 5 tabs for 2 days, then 4 tabs for 2 days, then 3 tabs for 2 days, 2 tabs for 2 days, then 1 tab by mouth daily for 2 days 03/29/20   Coralyn Mark, NP    Family History No family history on file.  Social History Social History   Tobacco Use  . Smoking status: Former Smoker    Packs/day: 0.50    Years: 15.00    Pack years: 7.50    Types: Cigarettes  . Smokeless tobacco: Never Used  Substance Use Topics  . Alcohol use: No  . Drug use: No     Allergies   Patient has no known allergies.   Review of Systems Review of Systems  Constitutional: Negative.   HENT: Negative.   Eyes: Negative.   Respiratory: Negative.   Cardiovascular: Negative.   Gastrointestinal: Negative.   Musculoskeletal:       Rt upper back pain not midline ,   Skin: Negative.   Neurological: Positive for dizziness.     Physical Exam Triage Vital Signs  ED Triage Vitals  Enc Vitals Group     BP 03/29/20 1146 (!) 155/88     Pulse Rate 03/29/20 1146 68     Resp 03/29/20 1146 18     Temp 03/29/20 1146 97.8 F (36.6 C)     Temp Source 03/29/20 1146 Oral     SpO2 03/29/20 1146 96 %     Weight 03/29/20 1143 180 lb (81.6 kg)     Height --      Head Circumference --      Peak Flow --      Pain Score 03/29/20 1142 0     Pain Loc --      Pain Edu? --      Excl. in GC? --    No data found.  Updated Vital Signs BP (!) 155/88 (BP Location: Left Arm)   Pulse 68   Temp 97.8 F (36.6 C) (Oral)   Resp 18   Wt 180 lb (81.6 kg)   SpO2 96%   BMI 29.05 kg/m   Visual Acuity Right Eye Distance:   Left Eye Distance:   Bilateral Distance:    Right Eye Near:   Left Eye Near:    Bilateral Near:     Physical Exam Constitutional:      Appearance:  Normal appearance.  Eyes:     Pupils: Pupils are equal, round, and reactive to light.  Cardiovascular:     Rate and Rhythm: Normal rate.     Pulses: Normal pulses.  Pulmonary:     Effort: Pulmonary effort is normal.  Abdominal:     General: Abdomen is flat.  Musculoskeletal:        General: Tenderness present.     Cervical back: Normal range of motion.     Comments: Rt midback to rt side not midline pain with movement only. Able to to flex and extend. No urinary concerns.   Skin:    General: Skin is warm.     Capillary Refill: Capillary refill takes less than 2 seconds.  Neurological:     General: No focal deficit present.     Mental Status: He is alert.     Comments: Intermit dizziness, alert x4, no facial droop , no numbness or weakness       UC Treatments / Results  Labs (all labs ordered are listed, but only abnormal results are displayed) Labs Reviewed  CBG MONITORING, ED    EKG   Radiology No results found.  Procedures Procedures (including critical care time)  Medications Ordered in UC Medications  ketorolac (TORADOL) 30 MG/ML injection 30 mg (has no administration in time range)    Initial Impression / Assessment and Plan / UC Course  I have reviewed the triage vital signs and the nursing notes.  Pertinent labs & imaging results that were available during my care of the patient were reviewed by me and considered in my medical decision making (see chart for details).    Will need to see pcp stinson for concern of BP educated pt on watching diet and avoid salt  Stay hydrated  Will need to go to the er for further testing if the dizziness cont or if he has weakness    Final Clinical Impressions(s) / UC Diagnoses   Final diagnoses:  Acute low back pain without sciatica, unspecified back pain laterality  Hypertension, unspecified type  Vertigo     Discharge Instructions     Will need to follow up for hypertension avoid high  salts and diet  Use  heat and ice as needed for pain Will need to follow up with primary and may need further testing      ED Prescriptions    Medication Sig Dispense Auth. Provider   meclizine (ANTIVERT) 25 MG tablet Take 1 tablet (25 mg total) by mouth 3 (three) times daily as needed for dizziness. 30 tablet Maple Mirza L, NP   predniSONE (STERAPRED UNI-PAK 21 TAB) 10 MG (21) TBPK tablet Take by mouth daily. Take 6 tabs by mouth daily  for 2 days, then 5 tabs for 2 days, then 4 tabs for 2 days, then 3 tabs for 2 days, 2 tabs for 2 days, then 1 tab by mouth daily for 2 days 42 tablet Coralyn Mark, NP     PDMP not reviewed this encounter.   Coralyn Mark, NP 03/29/20 1254

## 2020-04-01 ENCOUNTER — Telehealth: Payer: Self-pay | Admitting: Internal Medicine

## 2020-04-13 ENCOUNTER — Other Ambulatory Visit: Payer: Self-pay

## 2020-04-13 ENCOUNTER — Ambulatory Visit (INDEPENDENT_AMBULATORY_CARE_PROVIDER_SITE_OTHER): Payer: 59 | Admitting: Internal Medicine

## 2020-04-13 ENCOUNTER — Encounter: Payer: Self-pay | Admitting: Internal Medicine

## 2020-04-13 VITALS — BP 139/93 | HR 85 | Temp 97.3°F | Resp 17 | Ht 64.0 in | Wt 187.0 lb

## 2020-04-13 DIAGNOSIS — Z1159 Encounter for screening for other viral diseases: Secondary | ICD-10-CM

## 2020-04-13 DIAGNOSIS — Z23 Encounter for immunization: Secondary | ICD-10-CM

## 2020-04-13 DIAGNOSIS — E119 Type 2 diabetes mellitus without complications: Secondary | ICD-10-CM | POA: Diagnosis not present

## 2020-04-13 DIAGNOSIS — Z13228 Encounter for screening for other metabolic disorders: Secondary | ICD-10-CM

## 2020-04-13 DIAGNOSIS — R030 Elevated blood-pressure reading, without diagnosis of hypertension: Secondary | ICD-10-CM

## 2020-04-13 DIAGNOSIS — Z7689 Persons encountering health services in other specified circumstances: Secondary | ICD-10-CM | POA: Diagnosis not present

## 2020-04-13 LAB — POCT GLYCOSYLATED HEMOGLOBIN (HGB A1C): Hemoglobin A1C: 8.4 % — AB (ref 4.0–5.6)

## 2020-04-13 MED ORDER — HYDROXYZINE HCL 25 MG PO TABS: 25.0000 mg | ORAL_TABLET | Freq: Four times a day (QID) | ORAL | 0 refills | Status: DC

## 2020-04-13 MED ORDER — METFORMIN HCL 1000 MG PO TABS: 1000.0000 mg | ORAL_TABLET | Freq: Two times a day (BID) | ORAL | 2 refills | Status: DC

## 2020-04-13 NOTE — Progress Notes (Signed)
Subjective:    Nathaniel Silva - 64 y.o. male MRN [id number]  Date of birth: 04/08/1956  HPI  Nathaniel Silva is to establish care. Patient has a PMH significant for T2DM. Prior A1c unknown. Currently taking Metformin 500 mg BID. Has not missed any doses recently. Fasting CBGs varies a lot from 150-over 200s. Endorses polyuria and polydipsia. He is currently taking prednisone that was prescribed at the Urgent Care on 7/26.     ROS per HPI     Health Maintenance:  Health Maintenance Due  Topic Date Due  . Hepatitis C Screening  Never done  . PNEUMOCOCCAL POLYSACCHARIDE VACCINE AGE 69-64 HIGH RISK  Never done  . FOOT EXAM  Never done  . OPHTHALMOLOGY EXAM  Never done  . URINE MICROALBUMIN  Never done  . COVID-19 Vaccine (1) Never done  . TETANUS/TDAP  Never done  . COLONOSCOPY  Never done  . INFLUENZA VACCINE  04/04/2020     Past Medical History: Patient Active Problem List   Diagnosis Date Noted  . Type 2 diabetes mellitus without complication, without long-term current use of insulin (HCC) 04/13/2020  . Overweight(278.02) 10/19/2011      Social History   reports that he has quit smoking. His smoking use included cigarettes. He has a 7.50 pack-year smoking history. He has never used smokeless tobacco. He reports that he does not drink alcohol and does not use drugs.   Family History  family history is not on file.   Medications: reviewed and updated   Objective:   Physical Exam BP (!) 138/99   Pulse 85   Temp (!) 97.3 F (36.3 C) (Temporal)   Resp 17   Ht 5\' 4"  (1.626 m)   Wt 187 lb (84.8 kg)   SpO2 97%   BMI 32.10 kg/m  Physical Exam Constitutional:      General: He is not in acute distress.    Appearance: He is not diaphoretic.  HENT:     Head: Normocephalic and atraumatic.  Eyes:     Conjunctiva/sclera: Conjunctivae normal.  Cardiovascular:     Rate and Rhythm: Normal rate and regular rhythm.     Heart sounds: Normal heart sounds. No murmur heard.    Pulmonary:     Effort: Pulmonary effort is normal. No respiratory distress.     Breath sounds: Normal breath sounds.  Musculoskeletal:        General: Normal range of motion.  Skin:    General: Skin is warm and dry.  Neurological:     Mental Status: He is alert and oriented to person, place, and time.  Psychiatric:        Mood and Affect: Affect normal.        Judgment: Judgment normal.         Assessment & Plan:   1. Encounter to establish care Reviewed patient's PMH, social history, surgical history, and medications.   2. Type 2 diabetes mellitus without complication, without long-term current use of insulin (HCC) A1c 8.4. Increased Metformin to 1000 mg BID. Discussed that I am not sure what utility of prednisone Rx from urgent care was and patient is not sure either. Have recommend discontinuing due to low benefit and risk of raising CBGs.  Counseled on Diabetic diet, my plate method, minutes of moderate intensity exercise/week Blood sugar logs with fasting goals of 80-120 mg/dl, random of less than 809 and in the event of sugars less than 60 mg/dl or greater than 983 mg/dl encouraged to  notify the clinic. Advised on the need for annual eye exams, annual foot exams, Pneumonia vaccine. - Comprehensive metabolic panel - Microalbumin/Creatinine Ratio, Urine - Lipid Panel - HgB A1c - metFORMIN (GLUCOPHAGE) 1000 MG tablet; Take 1 tablet (1,000 mg total) by mouth 2 (two) times daily with a meal.  Dispense: 60 tablet; Refill: 2 - Pneumococcal polysaccharide vaccine 23-valent greater than or equal to 2yo subcutaneous/IM  3. Screening for metabolic disorder - CBC with Differential - Comprehensive metabolic panel - Lipid Panel  4. Need for hepatitis C screening test - HCV Ab w/Rflx to Verification  5. Need for prophylactic vaccination against Streptococcus pneumoniae (pneumococcus) - Pneumococcal polysaccharide vaccine 23-valent greater than or equal to 2yo  subcutaneous/IM  6. Elevated blood pressure reading without diagnosis of hypertension Will monitor at future visits. Would consider Ace or Arb therapy especially given h/o DM. Will plan to start at f/u if still elevated or sooner if labs reveal microalbuminuria.    Marcy Siren, D.O. 04/13/2020, 3:44 PM Primary Care at [facility]

## 2020-04-13 NOTE — Patient Instructions (Signed)
Thank you for choosing Primary Care at [facility] to be your medical home!    Keigo Whalley was seen by De Hollingshead, DO today.   Kathrine Cords primary care provider is Marcy Siren, DO.   For the best care possible, you should try to see Marcy Siren, DO whenever you come to the clinic.   We look forward to seeing you again soon!  If you have any questions about your visit today, please call us at [phone] or feel free to reach your primary care provider via MyChart.

## 2020-04-14 ENCOUNTER — Other Ambulatory Visit: Payer: Self-pay | Admitting: Internal Medicine

## 2020-04-14 DIAGNOSIS — E785 Hyperlipidemia, unspecified: Secondary | ICD-10-CM

## 2020-04-14 LAB — COMPREHENSIVE METABOLIC PANEL
ALT: 28 IU/L (ref 0–44)
AST: 20 IU/L (ref 0–40)
Albumin/Globulin Ratio: 1.4 (ref 1.2–2.2)
Albumin: 4.3 g/dL (ref 3.8–4.8)
Alkaline Phosphatase: 71 IU/L (ref 48–121)
BUN/Creatinine Ratio: 16 (ref 10–24)
BUN: 15 mg/dL (ref 8–27)
Bilirubin Total: 0.4 mg/dL (ref 0.0–1.2)
CO2: 25 mmol/L (ref 20–29)
Calcium: 9.2 mg/dL (ref 8.6–10.2)
Chloride: 98 mmol/L (ref 96–106)
Creatinine, Ser: 0.93 mg/dL (ref 0.76–1.27)
GFR calc Af Amer: 101 mL/min/{1.73_m2} (ref >59)
GFR calc non Af Amer: 87 mL/min/{1.73_m2} (ref >59)
Globulin, Total: 3.1 g/dL (ref 1.5–4.5)
Glucose: 161 mg/dL — ABNORMAL HIGH (ref 65–99)
Potassium: 4.5 mmol/L (ref 3.5–5.2)
Sodium: 140 mmol/L (ref 134–144)
Total Protein: 7.4 g/dL (ref 6.0–8.5)

## 2020-04-14 LAB — CBC WITH DIFFERENTIAL/PLATELET
Basophils Absolute: 0.1 10*3/uL (ref 0.0–0.2)
Basos: 2 %
EOS (ABSOLUTE): 0.2 10*3/uL (ref 0.0–0.4)
Eos: 4 %
Hematocrit: 47.5 % (ref 37.5–51.0)
Hemoglobin: 16.3 g/dL (ref 13.0–17.7)
Immature Grans (Abs): 0 10*3/uL (ref 0.0–0.1)
Immature Granulocytes: 0 %
Lymphocytes Absolute: 2.6 10*3/uL (ref 0.7–3.1)
Lymphs: 41 %
MCH: 27.7 pg (ref 26.6–33.0)
MCHC: 34.3 g/dL (ref 31.5–35.7)
MCV: 81 fL (ref 79–97)
Monocytes Absolute: 0.5 10*3/uL (ref 0.1–0.9)
Monocytes: 7 %
Neutrophils Absolute: 2.9 10*3/uL (ref 1.4–7.0)
Neutrophils: 46 %
Platelets: 278 10*3/uL (ref 150–450)
RBC: 5.89 x10E6/uL — ABNORMAL HIGH (ref 4.14–5.80)
RDW: 13.8 % (ref 11.6–15.4)
WBC: 6.3 10*3/uL (ref 3.4–10.8)

## 2020-04-14 LAB — LIPID PANEL
Chol/HDL Ratio: 6.2 ratio — ABNORMAL HIGH (ref 0.0–5.0)
Cholesterol, Total: 241 mg/dL — ABNORMAL HIGH (ref 100–199)
HDL: 39 mg/dL — ABNORMAL LOW (ref >39)
LDL Chol Calc (NIH): 162 mg/dL — ABNORMAL HIGH (ref 0–99)
Triglycerides: 217 mg/dL — ABNORMAL HIGH (ref 0–149)
VLDL Cholesterol Cal: 40 mg/dL (ref 5–40)

## 2020-04-14 LAB — HCV INTERPRETATION

## 2020-04-14 LAB — MICROALBUMIN / CREATININE URINE RATIO
Creatinine, Urine: 43.4 mg/dL
Microalb/Creat Ratio: <7 mg/g creat (ref 0–29)
Microalbumin, Urine: <3 ug/mL

## 2020-04-14 LAB — HCV AB W/RFLX TO VERIFICATION: HCV Ab: <0.1 s/co ratio (ref 0.0–0.9)

## 2020-04-14 MED ORDER — ATORVASTATIN CALCIUM 80 MG PO TABS: 80.0000 mg | ORAL_TABLET | Freq: Every day | ORAL | 3 refills | Status: DC

## 2020-04-14 MED ORDER — ASPIRIN 81 MG PO TBEC: 81.0000 mg | DELAYED_RELEASE_TABLET | Freq: Every day | ORAL | 12 refills | Status: AC

## 2020-04-16 NOTE — Progress Notes (Signed)
Patient notified of results & recommendations. Expressed understanding. Is agreeable to medication changes. Made 3 month follow up appointment on 07/16/2020 @ 9:10 AM.  Valentina Gu, [phone]

## 2020-04-19 ENCOUNTER — Other Ambulatory Visit: Payer: 59

## 2020-07-16 ENCOUNTER — Telehealth (INDEPENDENT_AMBULATORY_CARE_PROVIDER_SITE_OTHER): Payer: 59 | Admitting: Internal Medicine

## 2020-07-16 ENCOUNTER — Encounter: Payer: Self-pay | Admitting: Internal Medicine

## 2020-07-16 DIAGNOSIS — E119 Type 2 diabetes mellitus without complications: Secondary | ICD-10-CM

## 2020-07-16 DIAGNOSIS — E782 Mixed hyperlipidemia: Secondary | ICD-10-CM | POA: Diagnosis not present

## 2020-07-16 NOTE — Progress Notes (Signed)
Virtual Visit via Telephone Note  I connected with Dondra Spry, on 07/16/2020 at 9:19 AM by telephone due to the COVID-19 pandemic and verified that I am speaking with the correct person using two identifiers.   Consent: I discussed the limitations, risks, security and privacy concerns of performing an evaluation and management service by telephone and the availability of in person appointments. I also discussed with the patient that there may be a patient responsible charge related to this service. The patient expressed understanding and agreed to proceed.   Location of Patient: Home   Location of Provider: Clinic    Persons participating in Telemedicine visit: Judah Carchi [facility] Dr. Juleen China interpreter     History of Present Illness: Patient has a visit to follow up on chronic medical conditions.   Diabetes mellitus, Type 2 Disease Monitoring             Blood Sugar Ranges: Fasting - 130-150             Polyuria: no             Visual problems: no   Urine Microalbumin <7 (Aug 2021)   Last A1C: 8.4 (Aug 2021)   Medications: Metformin 1000 mg BID  Medication Compliance: no, reports only takes once per day due to it making him "feel discomfort"   Medication Side Effects             Hypoglycemia: no       No past medical history on file. No Known Allergies  Current Outpatient Medications on File Prior to Visit  Medication Sig Dispense Refill  . aspirin (EC-81 ASPIRIN) 81 MG EC tablet Take 1 tablet (81 mg total) by mouth daily. Swallow whole. 30 tablet 12  . atorvastatin (LIPITOR) 80 MG tablet Take 1 tablet (80 mg total) by mouth daily. (Patient taking differently: Take 80 mg by mouth 3 (three) times a week. ) 90 tablet 3  . metFORMIN (GLUCOPHAGE) 1000 MG tablet Take 1 tablet (1,000 mg total) by mouth 2 (two) times daily with a meal. (Patient taking differently: Take 1,000 mg by mouth daily with breakfast. ) 60 tablet 2   No current  facility-administered medications on file prior to visit.    Observations/Objective: NAD. Speaking clearly.  Work of breathing normal.  Alert and oriented. Mood appropriate.   Assessment and Plan: 1. Type 2 diabetes mellitus without complication, without long-term current use of insulin (HCC) Last A1c showed poor glycemic control. Fasting CBGs slightly above goal. Will monitor A1c. Patient compliant with Metformin only once per day.  Counseled on Diabetic diet, my plate method, 347 minutes of moderate intensity exercise/week Blood sugar logs with fasting goals of 80-120 mg/dl, random of less than 425 and in the event of sugars less than 60 mg/dl or greater than 956 mg/dl encouraged to notify the clinic. Advised on the need for annual eye exams, annual foot exams, Pneumonia vaccine. - Hemoglobin A1c; Future - Lipid Panel; Future - Comprehensive metabolic panel; Future - Ambulatory referral to Ophthalmology  2. Mixed hyperlipidemia Patient reports he is only taking Lipitor 3x per week as the medication causes increased thirst and polyuria. Discussed that these are not side effects of medications, and would be more concerned this could be related to uncontrolled DM. Will monitor lipid panel. Encouraged daily compliance.  - Lipid Panel; Future   Follow Up Instructions: Lab visit 11/18    I discussed the assessment and treatment plan with the patient. The patient  was provided an opportunity to ask questions and all were answered. The patient agreed with the plan and demonstrated an understanding of the instructions.   The patient was advised to call back or seek an in-person evaluation if the symptoms worsen or if the condition fails to improve as anticipated.     I provided 16 minutes total of non-face-to-face time during this encounter including median intraservice time, reviewing previous notes, investigations, ordering medications, medical decision making, coordinating care and  patient verbalized understanding at the end of the visit.    Marcy Siren, D.O. Primary Care at [facility]  07/16/2020, 9:19 AM

## 2020-07-21 ENCOUNTER — Other Ambulatory Visit: Payer: 59

## 2020-09-12 ENCOUNTER — Other Ambulatory Visit: Payer: Self-pay | Admitting: Internal Medicine

## 2020-09-12 DIAGNOSIS — E119 Type 2 diabetes mellitus without complications: Secondary | ICD-10-CM

## 2021-07-04 ENCOUNTER — Ambulatory Visit (HOSPITAL_COMMUNITY)
Admission: EM | Admit: 2021-07-04 | Discharge: 2021-07-04 | Disposition: A | Discharge status: Home / Self Care | Payer: BLUE CROSS/BLUE SHIELD | Attending: Student | Admitting: Student

## 2021-07-04 ENCOUNTER — Encounter (HOSPITAL_COMMUNITY): Payer: Self-pay | Admitting: *Deleted

## 2021-07-04 ENCOUNTER — Other Ambulatory Visit: Payer: Self-pay

## 2021-07-04 DIAGNOSIS — J208 Acute bronchitis due to other specified organisms: Secondary | ICD-10-CM | POA: Diagnosis not present

## 2021-07-04 DIAGNOSIS — E1165 Type 2 diabetes mellitus with hyperglycemia: Secondary | ICD-10-CM

## 2021-07-04 DIAGNOSIS — E1169 Type 2 diabetes mellitus with other specified complication: Secondary | ICD-10-CM

## 2021-07-04 DIAGNOSIS — Z789 Other specified health status: Secondary | ICD-10-CM

## 2021-07-04 MED ORDER — PREDNISONE 10 MG (21) PO TBPK: ORAL_TABLET | Freq: Every day | ORAL | 0 refills | Status: AC

## 2021-07-04 MED ORDER — PROMETHAZINE-DM 6.25-15 MG/5ML PO SYRP
5.0000 mL | ORAL_SOLUTION | Freq: Four times a day (QID) | ORAL | 0 refills | Status: AC | PRN
Start: 2021-07-04 — End: ?

## 2021-07-04 MED ORDER — ALBUTEROL SULFATE HFA 108 (90 BASE) MCG/ACT IN AERS: 1.0000 | INHALATION_SPRAY | Freq: Four times a day (QID) | RESPIRATORY_TRACT | 0 refills | Status: AC | PRN

## 2021-07-04 NOTE — Discharge Instructions (Addendum)
-  Prednisone taper for cough/bronchitis. I recommend taking this in the morning as it could give you energy.  Avoid NSAIDs like ibuprofen and alleve while taking this medication as they can increase your risk of stomach upset and even GI bleeding when in combination with a steroid. You can continue tylenol (acetaminophen) up to 1000mg  3x daily. Monitor sugars at home, if these reach 150 please call your PCP.  -Albuterol inhaler as needed for cough, wheezing, shortness of breath, 1 to 2 puffs every 6 hours as needed. -Promethazine DM cough syrup for congestion/cough. This could make you drowsy, so take at night before bed. -Follow-up with or PCP if symptoms worsen/persist, or new symptoms like shortness of breath

## 2021-07-04 NOTE — ED Provider Notes (Signed)
[facility]    CSN: [id number] Arrival date & time: 07/04/21  1557      History   Chief Complaint Chief Complaint  Patient presents with   Cough   Chills    HPI Bowie Scelsi is a 65 y.o. male presenting with cough and subjective chills for about 2 weeks.  Medical history diabetes.  Describes hacking cough, worse at night, productive of yellow and dark yellow sputum.  Denies shortness of breath.  Has not monitor temperature at home but does endorse subjective chills at nighttime.  Denies chest pain, dizziness, weakness.  Denies history of pulmonary disease.  HPI  History reviewed. No pertinent past medical history.  Patient Active Problem List   Diagnosis Date Noted   Hyperlipidemia 04/14/2020   Type 2 diabetes mellitus without complication, without long-term current use of insulin ([place]) 04/13/2020   Overweight(278.02) 10/19/2011    History reviewed. No pertinent surgical history.     Home Medications    Prior to Admission medications   Medication Sig Start Date End Date Taking? Authorizing Provider  albuterol (VENTOLIN HFA) 108 (90 Base) MCG/ACT inhaler Inhale 1-2 puffs into the lungs every 6 (six) hours as needed for wheezing or shortness of breath. 07/04/21  Yes Hazel Sams, PA-C  predniSONE (STERAPRED UNI-PAK 21 TAB) 10 MG (21) TBPK tablet Take by mouth daily. Take 6 tabs by mouth daily  for 2 days, then 5 tabs for 2 days, then 4 tabs for 2 days, then 3 tabs for 2 days, 2 tabs for 2 days, then 1 tab by mouth daily for 2 days 07/04/21  Yes Hazel Sams, PA-C  promethazine-dextromethorphan (PROMETHAZINE-DM) 6.25-15 MG/5ML syrup Take 5 mLs by mouth 4 (four) times daily as needed for cough. 07/04/21  Yes Hazel Sams, PA-C  aspirin (EC-81 ASPIRIN) 81 MG EC tablet Take 1 tablet (81 mg total) by mouth daily. Swallow whole. 04/14/20   Nicolette Bang, MD  metFORMIN (GLUCOPHAGE) 1000 MG tablet TAKE 1 TABLET(1000 MG) BY MOUTH TWICE DAILY WITH A MEAL  09/12/20   Camillia Herter, NP    Family History History reviewed. No pertinent family history.  Social History Social History   Tobacco Use   Smoking status: Former    Packs/day: 0.50    Years: 15.00    Pack years: 7.50    Types: Cigarettes   Smokeless tobacco: Never  Substance Use Topics   Alcohol use: No   Drug use: No     Allergies   Patient has no known allergies.   Review of Systems Review of Systems  Constitutional:  Negative for appetite change, chills and fever.  HENT:  Positive for congestion. Negative for ear pain, rhinorrhea, sinus pressure, sinus pain and sore throat.   Eyes:  Negative for redness and visual disturbance.  Respiratory:  Positive for cough. Negative for chest tightness, shortness of breath and wheezing.   Cardiovascular:  Negative for chest pain and palpitations.  Gastrointestinal:  Negative for abdominal pain, constipation, diarrhea, nausea and vomiting.  Genitourinary:  Negative for dysuria, frequency and urgency.  Musculoskeletal:  Negative for myalgias.  Neurological:  Negative for dizziness, weakness and headaches.  Psychiatric/Behavioral:  Negative for confusion.   All other systems reviewed and are negative.   Physical Exam Triage Vital Signs ED Triage Vitals  Enc Vitals Group     BP 07/04/21 1638 131/83     Pulse Rate 07/04/21 1638 94     Resp 07/04/21 1638 18  Temp 07/04/21 1638 98.6 F (37 C)     Temp src --      SpO2 07/04/21 1638 96 %     Weight --      Height --      Head Circumference --      Peak Flow --      Pain Score 07/04/21 1642 0     Pain Loc --      Pain Edu? --      Excl. in GC? --    No data found.  Updated Vital Signs BP 131/83   Pulse 94   Temp 98.6 F (37 C)   Resp 18   SpO2 96%   Visual Acuity Right Eye Distance:   Left Eye Distance:   Bilateral Distance:    Right Eye Near:   Left Eye Near:    Bilateral Near:     Physical Exam Vitals reviewed.  Constitutional:      General: He  is not in acute distress.    Appearance: Normal appearance. He is not ill-appearing.  HENT:     Head: Normocephalic and atraumatic.     Right Ear: Tympanic membrane, ear canal and external ear normal. No tenderness. No middle ear effusion. There is no impacted cerumen. Tympanic membrane is not perforated, erythematous, retracted or bulging.     Left Ear: Tympanic membrane, ear canal and external ear normal. No tenderness.  No middle ear effusion. There is no impacted cerumen. Tympanic membrane is not perforated, erythematous, retracted or bulging.     Nose: Nose normal. No congestion.     Mouth/Throat:     Mouth: Mucous membranes are moist.     Pharynx: Uvula midline. No oropharyngeal exudate or posterior oropharyngeal erythema.  Eyes:     Extraocular Movements: Extraocular movements intact.     Pupils: Pupils are equal, round, and reactive to light.  Cardiovascular:     Rate and Rhythm: Normal rate and regular rhythm.     Heart sounds: Normal heart sounds.  Pulmonary:     Effort: Pulmonary effort is normal.     Breath sounds: Normal breath sounds. No decreased breath sounds, wheezing, rhonchi or rales.  Abdominal:     Palpations: Abdomen is soft.     Tenderness: There is no abdominal tenderness. There is no guarding or rebound.  Lymphadenopathy:     Cervical: No cervical adenopathy.     Right cervical: No superficial cervical adenopathy.    Left cervical: No superficial cervical adenopathy.  Neurological:     General: No focal deficit present.     Mental Status: He is alert and oriented to person, place, and time.  Psychiatric:        Mood and Affect: Mood normal.        Behavior: Behavior normal.        Thought Content: Thought content normal.        Judgment: Judgment normal.     UC Treatments / Results  Labs (all labs ordered are listed, but only abnormal results are displayed) Labs Reviewed - No data to display  EKG   Radiology No results  found.  Procedures Procedures (including critical care time)  Medications Ordered in UC Medications - No data to display  Initial Impression / Assessment and Plan / UC Course  I have reviewed the triage vital signs and the nursing notes.  Pertinent labs & imaging results that were available during my care of the patient were reviewed by me and considered  in my medical decision making (see chart for details).     This patient is a very pleasant 65 y.o. year old male presenting with viral bronchitis. Today this pt is afebrile nontachycardic nontachypneic, oxygenating well on room air, no wheezes rhonchi or rales.  Has not taken antipyretic.  Has not monitored his symptoms at home.  Denies history of cardiopulmonary disease.  For diabetes, A1c last 7.9 few months ago.  Will send prednisone taper, albuterol, Promethazine DM.  Monitor sugars at home.  ED return precautions discussed. Patient verbalizes understanding and agreement.   Spoke with this patient using language line..   Final Clinical Impressions(s) / UC Diagnoses   Final diagnoses:  Viral bronchitis  Type 2 diabetes mellitus with other specified complication, without long-term current use of insulin ([place])  Language barrier     Discharge Instructions      -Prednisone taper for cough/bronchitis. I recommend taking this in the morning as it could give you energy.  Avoid NSAIDs like ibuprofen and alleve while taking this medication as they can increase your risk of stomach upset and even GI bleeding when in combination with a steroid. You can continue tylenol (acetaminophen) up to 1000mg  3x daily. Monitor sugars at home, if these reach 150 please call your PCP.  -Albuterol inhaler as needed for cough, wheezing, shortness of breath, 1 to 2 puffs every 6 hours as needed. -Promethazine DM cough syrup for congestion/cough. This could make you drowsy, so take at night before bed. -Follow-up with [place] or PCP if symptoms  worsen/persist, or new symptoms like shortness of breath   ED Prescriptions     Medication Sig Dispense Auth. Provider   albuterol (VENTOLIN HFA) 108 (90 Base) MCG/ACT inhaler Inhale 1-2 puffs into the lungs every 6 (six) hours as needed for wheezing or shortness of breath. 1 each Hazel Sams, PA-C   predniSONE (STERAPRED UNI-PAK 21 TAB) 10 MG (21) TBPK tablet Take by mouth daily. Take 6 tabs by mouth daily  for 2 days, then 5 tabs for 2 days, then 4 tabs for 2 days, then 3 tabs for 2 days, 2 tabs for 2 days, then 1 tab by mouth daily for 2 days 42 tablet Hazel Sams, PA-C   promethazine-dextromethorphan (PROMETHAZINE-DM) 6.25-15 MG/5ML syrup Take 5 mLs by mouth 4 (four) times daily as needed for cough. 118 mL Hazel Sams, PA-C      PDMP not reviewed this encounter.   Hazel Sams, PA-C 07/04/21 [phone]

## 2021-07-04 NOTE — ED Triage Notes (Signed)
Pt reports cough and chills started 2 weeks ago.
